# Patient Record
Sex: Male | Born: 1960
Health system: Southern US, Community
[De-identification: ages and names within clinical notes are randomized; demographics above are authoritative.]

---

## 2008-12-26 ENCOUNTER — Emergency Department: Payer: Self-pay | Admitting: Emergency Medicine

## 2012-02-07 ENCOUNTER — Ambulatory Visit: Payer: Self-pay | Admitting: Orthopedic Surgery

## 2012-02-27 ENCOUNTER — Ambulatory Visit: Payer: Self-pay | Admitting: Pain Medicine

## 2013-02-11 ENCOUNTER — Ambulatory Visit: Payer: Self-pay | Admitting: Gastroenterology

## 2013-02-13 LAB — PATHOLOGY REPORT

## 2013-07-05 ENCOUNTER — Inpatient Hospital Stay: Payer: Self-pay | Admitting: Internal Medicine

## 2013-07-05 LAB — COMPREHENSIVE METABOLIC PANEL
ALT: 25 U/L (ref 12–78)
Albumin: 3.4 g/dL (ref 3.4–5.0)
Alkaline Phosphatase: 59 U/L
Anion Gap: 6 — ABNORMAL LOW (ref 7–16)
BUN: 17 mg/dL (ref 7–18)
Bilirubin,Total: 0.9 mg/dL (ref 0.2–1.0)
CO2: 30 mmol/L (ref 21–32)
CREATININE: 1.25 mg/dL (ref 0.60–1.30)
Calcium, Total: 8.9 mg/dL (ref 8.5–10.1)
Chloride: 100 mmol/L (ref 98–107)
GLUCOSE: 111 mg/dL — AB (ref 65–99)
Osmolality: 274 (ref 275–301)
POTASSIUM: 3.9 mmol/L (ref 3.5–5.1)
SGOT(AST): 15 U/L (ref 15–37)
Sodium: 136 mmol/L (ref 136–145)
Total Protein: 7.4 g/dL (ref 6.4–8.2)

## 2013-07-05 LAB — CBC WITH DIFFERENTIAL/PLATELET
Basophil #: 0 10*3/uL (ref 0.0–0.1)
Basophil %: 0.2 %
EOS ABS: 0.1 10*3/uL (ref 0.0–0.7)
EOS PCT: 0.6 %
HCT: 42 % (ref 40.0–52.0)
HGB: 14.5 g/dL (ref 13.0–18.0)
Lymphocyte #: 1.5 10*3/uL (ref 1.0–3.6)
Lymphocyte %: 7.7 %
MCH: 32.9 pg (ref 26.0–34.0)
MCHC: 34.5 g/dL (ref 32.0–36.0)
MCV: 95 fL (ref 80–100)
MONOS PCT: 8.6 %
Monocyte #: 1.7 x10 3/mm — ABNORMAL HIGH (ref 0.2–1.0)
Neutrophil #: 16.5 10*3/uL — ABNORMAL HIGH (ref 1.4–6.5)
Neutrophil %: 82.9 %
Platelet: 231 10*3/uL (ref 150–440)
RBC: 4.4 10*6/uL (ref 4.40–5.90)
RDW: 13.8 % (ref 11.5–14.5)
WBC: 19.9 10*3/uL — AB (ref 3.8–10.6)

## 2013-07-05 LAB — URINALYSIS, COMPLETE
Bacteria: NONE SEEN
Bilirubin,UR: NEGATIVE
Glucose,UR: NEGATIVE mg/dL (ref 0–75)
LEUKOCYTE ESTERASE: NEGATIVE
NITRITE: NEGATIVE
Ph: 6 (ref 4.5–8.0)
Protein: NEGATIVE
RBC,UR: 2 /HPF (ref 0–5)
Specific Gravity: 1.013 (ref 1.003–1.030)
Squamous Epithelial: 2

## 2013-07-05 LAB — LIPID PANEL
Cholesterol: 110 mg/dL (ref 0–200)
HDL: 41 mg/dL (ref 40–60)
Ldl Cholesterol, Calc: 57 mg/dL (ref 0–100)
Triglycerides: 58 mg/dL (ref 0–200)
VLDL CHOLESTEROL, CALC: 12 mg/dL (ref 5–40)

## 2013-07-05 LAB — LIPASE, BLOOD: Lipase: 245 U/L (ref 73–393)

## 2013-07-06 LAB — CBC WITH DIFFERENTIAL/PLATELET
BASOS PCT: 0.2 %
Basophil #: 0 10*3/uL (ref 0.0–0.1)
EOS ABS: 0.3 10*3/uL (ref 0.0–0.7)
Eosinophil %: 2.3 %
HCT: 37.9 % — AB (ref 40.0–52.0)
HGB: 12.5 g/dL — ABNORMAL LOW (ref 13.0–18.0)
LYMPHS ABS: 1 10*3/uL (ref 1.0–3.6)
LYMPHS PCT: 8.2 %
MCH: 31.6 pg (ref 26.0–34.0)
MCHC: 33 g/dL (ref 32.0–36.0)
MCV: 96 fL (ref 80–100)
Monocyte #: 1.3 x10 3/mm — ABNORMAL HIGH (ref 0.2–1.0)
Monocyte %: 10.4 %
NEUTROS ABS: 10 10*3/uL — AB (ref 1.4–6.5)
Neutrophil %: 78.9 %
Platelet: 183 10*3/uL (ref 150–440)
RBC: 3.95 10*6/uL — AB (ref 4.40–5.90)
RDW: 13.9 % (ref 11.5–14.5)
WBC: 12.7 10*3/uL — ABNORMAL HIGH (ref 3.8–10.6)

## 2013-07-06 LAB — COMPREHENSIVE METABOLIC PANEL
ALT: 19 U/L (ref 12–78)
ANION GAP: 7 (ref 7–16)
Albumin: 2.7 g/dL — ABNORMAL LOW (ref 3.4–5.0)
Alkaline Phosphatase: 51 U/L
BUN: 15 mg/dL (ref 7–18)
Bilirubin,Total: 1.3 mg/dL — ABNORMAL HIGH (ref 0.2–1.0)
Calcium, Total: 8 mg/dL — ABNORMAL LOW (ref 8.5–10.1)
Chloride: 103 mmol/L (ref 98–107)
Co2: 28 mmol/L (ref 21–32)
Creatinine: 1.41 mg/dL — ABNORMAL HIGH (ref 0.60–1.30)
EGFR (Non-African Amer.): 57 — ABNORMAL LOW
GLUCOSE: 86 mg/dL (ref 65–99)
Osmolality: 276 (ref 275–301)
Potassium: 3.8 mmol/L (ref 3.5–5.1)
SGOT(AST): 19 U/L (ref 15–37)
SODIUM: 138 mmol/L (ref 136–145)
Total Protein: 6.4 g/dL (ref 6.4–8.2)

## 2013-07-06 LAB — LIPID PANEL
CHOLESTEROL: 94 mg/dL (ref 0–200)
HDL Cholesterol: 28 mg/dL — ABNORMAL LOW (ref 40–60)
Ldl Cholesterol, Calc: 51 mg/dL (ref 0–100)
Triglycerides: 76 mg/dL (ref 0–200)
VLDL CHOLESTEROL, CALC: 15 mg/dL (ref 5–40)

## 2013-07-07 LAB — COMPREHENSIVE METABOLIC PANEL
ALBUMIN: 2.8 g/dL — AB (ref 3.4–5.0)
ALK PHOS: 53 U/L
ALT: 20 U/L (ref 12–78)
ANION GAP: 4 — AB (ref 7–16)
AST: 18 U/L (ref 15–37)
BUN: 15 mg/dL (ref 7–18)
Bilirubin,Total: 0.4 mg/dL (ref 0.2–1.0)
CO2: 29 mmol/L (ref 21–32)
CREATININE: 1.45 mg/dL — AB (ref 0.60–1.30)
Calcium, Total: 8.2 mg/dL — ABNORMAL LOW (ref 8.5–10.1)
Chloride: 104 mmol/L (ref 98–107)
EGFR (African American): >60
GFR CALC NON AF AMER: 55 — AB
GLUCOSE: 80 mg/dL (ref 65–99)
Osmolality: 274 (ref 275–301)
POTASSIUM: 3.7 mmol/L (ref 3.5–5.1)
SODIUM: 137 mmol/L (ref 136–145)
TOTAL PROTEIN: 7 g/dL (ref 6.4–8.2)

## 2013-07-07 LAB — CBC WITH DIFFERENTIAL/PLATELET
BASOS ABS: 0 10*3/uL (ref 0.0–0.1)
Basophil %: 0.3 %
EOS PCT: 3 %
Eosinophil #: 0.3 10*3/uL (ref 0.0–0.7)
HCT: 38 % — ABNORMAL LOW (ref 40.0–52.0)
HGB: 12.6 g/dL — ABNORMAL LOW (ref 13.0–18.0)
LYMPHS ABS: 1.7 10*3/uL (ref 1.0–3.6)
Lymphocyte %: 16.4 %
MCH: 32.4 pg (ref 26.0–34.0)
MCHC: 33.3 g/dL (ref 32.0–36.0)
MCV: 98 fL (ref 80–100)
MONO ABS: 0.9 x10 3/mm (ref 0.2–1.0)
MONOS PCT: 8.5 %
Neutrophil #: 7.5 10*3/uL — ABNORMAL HIGH (ref 1.4–6.5)
Neutrophil %: 71.8 %
PLATELETS: 231 10*3/uL (ref 150–440)
RBC: 3.9 10*6/uL — ABNORMAL LOW (ref 4.40–5.90)
RDW: 14 % (ref 11.5–14.5)
WBC: 10.5 10*3/uL (ref 3.8–10.6)

## 2013-07-09 LAB — COMPREHENSIVE METABOLIC PANEL
ALBUMIN: 2.3 g/dL — AB (ref 3.4–5.0)
ALK PHOS: 44 U/L — AB
ANION GAP: 8 (ref 7–16)
BUN: 11 mg/dL (ref 7–18)
Bilirubin,Total: 0.3 mg/dL (ref 0.2–1.0)
CHLORIDE: 106 mmol/L (ref 98–107)
CO2: 29 mmol/L (ref 21–32)
Calcium, Total: 8.1 mg/dL — ABNORMAL LOW (ref 8.5–10.1)
Creatinine: 1.35 mg/dL — ABNORMAL HIGH (ref 0.60–1.30)
EGFR (African American): >60
EGFR (Non-African Amer.): 60 — ABNORMAL LOW
Glucose: 92 mg/dL (ref 65–99)
OSMOLALITY: 284 (ref 275–301)
Potassium: 3.4 mmol/L — ABNORMAL LOW (ref 3.5–5.1)
SGOT(AST): 27 U/L (ref 15–37)
SGPT (ALT): 25 U/L (ref 12–78)
Sodium: 143 mmol/L (ref 136–145)
Total Protein: 6.1 g/dL — ABNORMAL LOW (ref 6.4–8.2)

## 2014-05-02 NOTE — Consult Note (Signed)
Pt feels better, had 4-5 good bowel movements from the Miralax last night.  got morphine once and percocet once during nite.  Abd pain now rated a 4-5.  Palpation of abd shows no signif tenderness, no masses. He is afebrile and WBC down to normal.  Will try on low fat diet and if does well could go home on pain meds and laxatives.  Electronic Signatures: Manya Silvas (MD)  (Signed on 30-Jun-15 07:12)  Authored  Last Updated: 30-Jun-15 07:12 by Manya Silvas (MD)

## 2014-05-02 NOTE — Consult Note (Signed)
Pt still with discomfort, getting pain meds which helps some.  He is afebrile and WBC down to 12.7, his TB is up slightly and need to follow this and repeat LFT's tomorrow. Will give Fleets enema and MOM for bowels.  A possible stone could be origin of all problems.  Consider Actigal to prevent future stone formation.  Will give water today and clear liq tomorrow.  Follow labs and pain report.  Electronic Signatures: Manya Silvas (MD)  (Signed on 28-Jun-15 11:40)  Authored  Last Updated: 28-Jun-15 11:40 by Manya Silvas (MD)

## 2014-05-02 NOTE — Consult Note (Signed)
Pt has CT showing pancreatitis in head of pancreas.  A small bubble like place may be diverticulum of duodenum.  He is likely 3 days into a spell of pancreatitis with hx of fever, constipation (ileus) and abd pain and tenderness.  Dr. Leanora Cover was kind enough to review the CT and agrees with this diagnosis.  Pancreas divisum is a possible cause, could have passed a stone as the cause.  I explained to them that there was no definitive treatment except support with fluids and pain medicine.  Electronic Signatures: Manya Silvas (MD)  (Signed on 27-Jun-15 18:45)  Authored  Last Updated: 27-Jun-15 18:45 by Manya Silvas (MD)

## 2014-05-02 NOTE — Consult Note (Signed)
PATIENT NAME:  Eric Fitzpatrick, Eric Fitzpatrick MR#:  [id number] DATE OF BIRTH:  [birth year]  DATE OF CONSULTATION:  07/05/2013  CONSULTING PHYSICIAN:  Manya Silvas, MD  HISTORY OF PRESENT ILLNESS: The patient is a 54 year old white male whose story began Wednesday afternoon. He noted abdominal pain and pain in his back. Wednesday was his last bowel movement. He felt discomfort again Thursday, the same abdominal pain and pain in the back. Friday he went to work feeling bad. He was found to have a temperature elevation of 101.7. He took an Advil and a colon cleanser before going to bed. Temperature today was up to 102. He feels like he hurts all around his abdomen and into his back. He feels kind of impacted. He has not had a good bowel movement in 3 days. When he tries to push and have a bowel movement he gets pain in the back of his head and pain in his back. He has 2 herniated disks that he thinks are hurting when he squeezes down to move his bowels. He denies any vomiting. There is no rash. No sore throat. Yesterday he ate a little bit. Today he has not eaten anything. There is no change in the pain with eating. His last big meal was Thursday night. He felt very uncomfortable for several hours after eating and felt some better Friday morning. He has never had a spell like this before.   ALLERGIES: No known drug allergies.   MEDICATIONS: Trazodone, Uloric, Ambien, Xanax, hydrochlorothiazide, tramadol, metoprolol, lisinopril, and Synthroid.   PAST MEDICAL HISTORY: He has had kidney surgery when he was 54 years old and had bladder surgery. They "took out 2 kidneys" and did bladder work. Dr. Ernst Spell did a vasectomy reattachment. Apparently it had been cut when he had his kidney surgery.   SOCIAL HISTORY: He works as a Pharmacist, hospital. He recently went on a business trip to [place] where he stayed a week. He did have a bit of a sore throat while he was there. He actually was there June 1st through the 12th. He ate  local food but did not eat any street vendor food. He has never had an illness like his current illness   In the ER he had a CAT scan and they called it pancreatitis in the head of the pancreas, but not in the body and the tail. A small pocket of a gas could be noted in the pancreas. Dr. Leanora Cover was kind enough to look at the CAT scan also and agreed with the pancreatitis call but felt that the small pocket of gas was likely from a duodenal diverticulum.   OTHER MEDICAL PROBLEMS: Include gout and hypertension and moderate obesity   PHYSICAL EXAMINATION:  VITAL SIGNS: Temperature 97, pulse 78, respirations 20, blood pressure 154/89. GENERAL: A somewhat obese white male in no acute distress.  HEENT: Sclerae nonicteric. Conjunctivae negative. Tongue negative. The head is atraumatic.  CHEST: Clear.  HEART: No murmurs or gallops I can hear.  ABDOMEN: Old scars present in the suprapubic area. The bowel sounds are present but diminished. No hepatosplenomegaly. There is diffuse mild tenderness, worse in the epigastric area.  SKIN: Warm and dry.  PSYCHIATRIC: Mood and affect are appropriate.   LABORATORY DATA: Ultrasound shows no abnormalities. Ultrasound of the abdomen shows no abnormalities. CAT scan of the abdomen shows the mentioned findings in the pancreas.   Blood work: A MET-C shows a glucose of 111, otherwise MET-C is completely negative. Liver panel is negative.  White count is 19.9, hemoglobin 14.5, hematocrit 42. Urinalysis 1+ blood, trace ketones, negative leukocyte esterases.   A 3-way abdomen and chest shows small bowel decompressed, normal distribution of gas and stool throughout the colon. No abnormality seen.   Also his lipase is normal.   ASSESSMENT: This is probably a spell of pancreatitis into the third day or so. He has a normal lipase, which makes the diagnosis a little more difficult. The etiology of this is uncertain. He is not a significant alcohol a drinker. Most likely cause  would be a passed gallstone, could have happened 3 days ago, or so. Another possibility brought  up by Dr. Leanora Cover is he could have pancreas divisum producing pancreatitis. I explained to the patient and his wife that the treatment is essentially supportive, that there is no definitive therapy for pancreatitis except bowel rest. He Eric Fitzpatrick be given pain medication as needed,  nausea medicine as needed, and IV fluids and I Eric Fitzpatrick follow with you.    ____________________________ Manya Silvas, MD rte:lt D: 07/05/2013 18:54:00 ET T: 07/05/2013 19:33:50 ET JOB#: [id number]  cc: Ceasar Lund. Anselm Jungling, [place] Sarina Ser, MD Manya Silvas, MD, <Dictator>   Manya Silvas MD ELECTRONICALLY SIGNED 07/22/2013 17:41

## 2014-05-02 NOTE — Discharge Summary (Signed)
Dates of Admission and Diagnosis:  Date of Admission 05-Jul-2013   Date of Discharge 09-Jul-2013   Admitting Diagnosis Abdominal pain, fever, constipation   Final Diagnosis Acute pancreatitis   Discharge Diagnosis 1 Acute pancreatitis   2 Hypertension   3 hypothyroidism    Chief Complaint/History of Present Illness 54 year old male presented to the ED with abdominal pain, fever and constipation for three days. CT abdomen revealed + inflammation of head of pancreas, c/w acute pancreatitis, RUQ u/s was negative.   Allergies:  No Known Allergies:     Hepatic:  01-Jul-15 04:07   Bilirubin, Total 0.3  Alkaline Phosphatase  44 (45-117 NOTE: New Reference Range 11/29/12)  SGPT (ALT) 25  SGOT (AST) 27  Total Protein, Serum  6.1  Albumin, Serum  2.3  Routine Chem:  27-Jun-15 10:26   Creatinine (comp) 1.25  28-Jun-15 04:57   Creatinine (comp)  1.41  29-Jun-15 04:44   Creatinine (comp)  1.45  01-Jul-15 04:07   Glucose, Serum 92  BUN 11  Creatinine (comp)  1.35  Sodium, Serum 143  Potassium, Serum  3.4  Chloride, Serum 106  CO2, Serum 29  Calcium (Total), Serum  8.1  Osmolality (calc) 284  eGFR (African American) >60  eGFR (Non-African American)  60 (eGFR values <35m/min/1.73 m2 may be an indication of chronic kidney disease (CKD). Calculated eGFR is useful in patients with stable renal function. The eGFR calculation will not be reliable in acutely ill patients when serum creatinine is changing rapidly. It is not useful in  patients on dialysis. The eGFR calculation may not be applicable to patients at the low and high extremes of body sizes, pregnant women, and vegetarians.)  Anion Gap 8  Routine Hem:  27-Jun-15 10:26   WBC (CBC)  19.9  28-Jun-15 04:57   WBC (CBC)  12.7  29-Jun-15 04:44   WBC (CBC) 10.5   PERTINENT RADIOLOGY STUDIES: XRay:    27-Jun-15 11:27, Abdomen 3 Way Includes PA Chest  Abdomen 3 Way Includes PA Chest   REASON FOR EXAM:     Pain  COMMENTS:   May transport without cardiac monitor    PROCEDURE: DXR - DXR ABDOMEN 3-WAY (INCL PA CXR)  - Jul 05 2013 11:27AM     CLINICAL DATA:  lower abd pain, nausea, last BM Wednesday    EXAM:  ABDOMEN SERIES    COMPARISON:  None.    FINDINGS:  Lungs are clear. Heart size upper limits normal. No effusion. No  free air. Small bowel decompressed. Normal distribution gas and  stool throughout the colon. Surgical clips in the pelvis. Regional  bones unremarkable. No abnormal abdominal calcification.     IMPRESSION:  1. No acute abnormality.      Electronically Signed    By: DArne ClevelandM.D.    On: 07/05/2013 11:33         Verified By: DKandis Cocking M.D.,  [place]    27-Jun-15 11:52, [place]  [place] Survey   REASON FOR EXAM:    ruq pain, nausea, tender, +murphy. anorexia  COMMENTS:   May transport without cardiac monitor    PROCEDURE: [place] - [place] LIMITED SURVEY  - Jul 05 2013 11:52AM     CLINICAL DATA:  ruq pain, nausea, tender, +murphy. anorexia    EXAM:  [place] - RIGHT UPPER QUADRANT    COMPARISON:  Radiographs from the same day    FINDINGS:  Gallbladder:  There is  no evidence for gallstones, gallbladder wall thickening or  pericholecystic fluid. The sonographer reports no sonographic  Murphy's sign.    Common bile duct:    Diameter: 3.7 mm , unremarkable    Liver:    No focal lesion identified. Within normal limits in parenchymal  echogenicity.     IMPRESSION:  1. Negative.  Normal gallbladder.  Electronically Signed    By: Arne Cleveland M.D.    On: 07/05/2013 11:54         Verified By: Kandis Cocking, M.D.,  CT:    27-Jun-15 14:48, CT Abdomen and Pelvis With Contrast  CT Abdomen and Pelvis With Contrast   REASON FOR EXAM:    (1) diffuse abd pain x 3 days, anorexia, wbc 20, ruq   + rlq ttp; (2) diffuse abd  COMMENTS:   May transport without cardiac monitor    PROCEDURE: CT  - CT  ABDOMEN / PELVIS  W  - Jul 05 2013  2:48PM     CLINICAL DATA:  Abdominal pain.    EXAM:  CT ABDOMEN AND PELVIS WITH CONTRAST    TECHNIQUE:  Multidetector CT imaging of the abdomen and pelvis was performed  using the standard protocol following bolus administration of  intravenous contrast.  CONTRAST:  85 cc Isovue-300    COMPARISON:  None.    FINDINGS:  Diffuse hepatic steatosis. Tiny hypodensity adjacent to the  gallbladder is nonspecific.    Spleen, adrenal glands are within normal limits    Inflammatory changes involving the head of the pancreas are present.  There is a loculated gas in the head of the pancreas which may  simply represent biliary gas. No hemorrhage or acute fluid  collection.  Right kidney is severely atrophic and is virtually non functioning.    Left hydronephrosis is present. No ureteral calculus. Contrast is  excreted into the left collecting system up on delayed imaging.  Postoperative changes are seen in the region of the distal left  ureter.    Bladder is distended.    Cecum is distended with stool.     IMPRESSION:  Findings consistent with acute pancreatitis. There is a loculated  gas in the pancreatic head which may represent biliary gas as one  would see with prior biliary instrumentation. An inflammatory  process within the pancreatic parenchyma cannot be excluded.    Severe atrophy of the right kidney.      Electronically Signed    By: Maryclare Bean M.D.    On: 07/05/2013 15:26         Verified By: Jamas Lav, M.D.,   Pertinent Past History:  Pertinent Past History Hypertension Hypothyroidism Gout   Hospital Course:  Hospital Course 54 year old male admitted with abdominal pain, fever and constipation, CT abdomen revealed + inflammation of head of pancreas, c/w acute pancreatitis, RUQ u/s was negative. Patient was evaluated by GI, impression hehas likely recently passed a gallstone. Patient was initially NPO. He received  supportive care, including i.v. fluids, i.v Zosyn, Pantoprazole, pain mmt and Zofran prn. Abdominal pain decreased gradually. Nausea and vomiting resolved. Diet was advanced to low fat diet. Lisinopril and Metoprolol were continued for hypertension. He received his usual dose of Levothyroxine for hypothyroidism. Patient is feeling much better today. He is eating lunch. Denies any nausea. No episodes of vomiting for 24 hours. He is eager to go home today. Wife at bedside. Exam: Alert, oriented x 3, NAD. Chest: clear, CVS:RRR, Abdomen: soft, nontender, + BS,  Ext: no edema. Plan is to discharge home today.   Condition on Discharge Stable   DISCHARGE INSTRUCTIONS HOME MEDS:  Medication Reconciliation: Patient's Home Medications at Discharge:     Medication Instructions  trazodone 100 mg oral tablet  1 tab(s) orally once (at bedtime)   levothyrox 175 mcg (0.175 mg) oral tablet  1 tab(s) orally once a day   lisinopril 10 mg oral tablet  1 tab(s) orally once a day   metoprolol 100 mg oral tablet  1 tab(s) orally 2 times a day   hydrochlorothiazide 25 mg oral tablet  1 tab(s) orally once a day   alprazolam 0.5 mg oral tablet  1 tab(s) orally 2 times a day   zolpidem 10 mg oral tablet  1 tab(s) orally once a day (at bedtime)   uloric 40 mg oral tablet  1 tab(s) orally once a day   oxycodone 10 mg oral tablet  1 tab(s) orally every 4 hours, As needed, pain   pantoprazole 40 mg oral delayed release tablet  1 tab(s) orally once a day    STOP TAKING THE FOLLOWING MEDICATION(S):    tramadol 50 mg oral tablet: 1 tab(s) orally every 4 hours, As Needed - for Pain  Physician's Instructions:  Diet Low Sodium  Low Fat, Low Cholesterol   Activity Limitations As tolerated   Return to Work on Monday   Time frame for Follow Up Appointment 1-2 weeks  Dr. Gilford Rile   Time frame for Follow Up Appointment 1-2 weeks  [facility]     Gaylyn Cheers T(Consultant): [facility], [address] [place],  [place] [address], [place]   Lynett Fish B(Family Physician): [facility], [address] [place],  [address], [place] [phone]  Electronic Signatures: Glendon Axe (MD)  (Signed 01-Jul-15 13:32)  Authored: ADMISSION DATE AND DIAGNOSIS, CHIEF COMPLAINT/HPI, Allergies, PERTINENT LABS, PERTINENT RADIOLOGY STUDIES, PERTINENT PAST HISTORY, HOSPITAL COURSE, [place], PATIENT INSTRUCTIONS, Follow Up Physician   Last Updated: 01-Jul-15 13:32 by Glendon Axe (MD)

## 2014-05-02 NOTE — Consult Note (Signed)
Pt slept well and did not get pain medication during nite, more sore today, VSS and WBC nl.  He did not get any bowel movement with the Fleets enema so will give 2 quarts of gatoraid today.  He has problem with veins and IV site accsss, may get a PIC line.   Electronic Signatures: Manya Silvas (MD)  (Signed on 29-Jun-15 13:19)  Authored  Last Updated: 29-Jun-15 13:19 by Manya Silvas (MD)

## 2014-05-02 NOTE — H&P (Signed)
PATIENT NAME:  Eric Fitzpatrick, Eric Fitzpatrick MR#:  [id number] DATE OF BIRTH:  [birth year]  DATE OF ADMISSION:  07/05/2013  NO DICTATION  ____________________________ Ceasar Lund. Anselm Jungling, MD vgv:lt D: 07/05/2013 16:50:57 ET T: 07/05/2013 20:20:07 ET JOB#: [id number]  cc: Ceasar Lund. Anselm Jungling, MD, <Dictator> Vaughan Basta MD ELECTRONICALLY SIGNED 07/07/2013 11:10

## 2014-05-02 NOTE — H&P (Signed)
PATIENT NAME:  Eric Fitzpatrick, EDELL MR#:  [id number] DATE OF BIRTH:  1960/09/19  DATE OF ADMISSION:  07/05/2013  PRIMARY CARE PHYSICIAN: Dr. Lisette Grinder, [facility].   REFERRING PHYSICIAN: Dr. Carrie Mew with ER.  CHIEF COMPLAINT: Abdominal pain.   HISTORY OF PRESENTING ILLNESS: This is a 54 year old male with a past history of hypertension, hypothyroidism, and gout who is in his usual state of health. He has visited [place] for his business purpose 2 weeks ago, and since he came, he is feeling a little fatigued, and now for the last 3-4 days, he started having abdominal pain which is in the central area, constant, pressure-like, 8/10. No nausea or vomiting but feels like bloated and is constipated.  Passing just minimal gas, but no stool for the last 3 days, and so because of that, he is not feeling too much hungry and he stopped eating since yesterday.  Decided to come to Emergency Room as he also noticed some fever last evening 101.7 and today morning 102 degrees Fahrenheit. In ER, the x-ray was negative, but CT scan of the abdomen showed some inflammation of pancreatic head and some gas around the pancreatic duct.  ER physician spoke to Dr. Vira Agar, GI on-call doctor, and he suggested to admit the patient for acute pancreatitis. Hospitalist service was contacted.   REVIEW OF SYSTEMS:  CONSTITUTIONAL: Negative for fatigue but positive for fever and abdominal pain.   EYES:  No blurring, double vision, discharge or redness.  EARS, NOSE, THROAT: No tinnitus, ear pain or hearing loss.  RESPIRATORY: No cough, wheezing, hemoptysis or shortness of breath.  CARDIOVASCULAR: No chest pain, orthopnea, edema, arrhythmia, palpitations.  GASTROINTESTINAL: The patient does not have nausea, vomiting or diarrhea, but has abdominal pain and constipation.  GENITOURINARY: No dysuria, hematuria or increased frequency.  ENDOCRINE: No increased sweating. No heat or cold intolerance.  SKIN: No acne, rashes, or  lesions.  MUSCULOSKELETAL: No pain or swelling in the joints.  NEUROLOGICAL: No numbness, weakness, tremor, or vertigo.  PSYCHIATRIC: No anxiety, insomnia, bipolar disorder.   PAST MEDICAL HISTORY:  Hypertension, hypothyroid, and gout.   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    SOCIAL HISTORY:  Denies any alcohol, smoking, or illegal drug use. He just drinks socially, which is not even once a month. 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   FAMILY HISTORY: Father had leukemia.   HOME MEDICATIONS: 1. Zolpidem 10 mg oral tablet once a day.  2. Uloric 40 mg oral once a day.  3. Trazodone100 mg oral once a day.  4. Tramadol 50 mg oral tablet every 4 hours as needed for pain.  5. Metoprolol 100 mg oral 2 times a day.  6. Lisinopril 10 mg oral once a day.  7. Levothyroxine 175 mcg oral once a day.  8. Hydrochlorothiazide 25 mg oral once a day.  9. Alprazolam 0.5 mg oral tablet 2 times a day.   VITAL SIGNS: In ER, temperature 98.6, pulse 76, respirations 18, blood pressure 152/98, and pulse oximetry is 96 on room air.   PHYSICAL EXAMINATION: GENERAL:  The patient is fully alert and oriented to time, place, and person. Does not appear in any acute distress.  HEENT: Head and neck atraumatic. Conjunctivae pink. Oral mucosa moist.  NECK: Supple. No JVD.  RESPIRATORY: Bilaterally equal and clear air entry.  CARDIOVASCULAR: S1, S2 present, regular. No murmur.  ABDOMEN: Soft, nontender. Bowel  sounds present. No organomegaly but appears to be slightly distended.  SKIN: No rashes, acne or lesions.  MUSCULOSKELETAL: No pain or swelling in the joints.  NEUROLOGICAL: No numbness, weakness, tremor. Moves all 4 limbs. Power 5/5.  No rigidity. JOINTS: No swelling or tenderness.  LEGS: No edema.   PSYCHIATRIC: Does not appear in any acute psychiatric illness at this time.   IMPORTANT LABORATORY AND DIAGNOSTIC RESULTS:   Abdomen x-ray:  No acute abnormality. CT scan of the abdomen and pelvis with contrast showed consistent with acute pancreatitis, loculated gas in the pancreatic head, biliary gas and inflammatory process. Pancreatic parenchyma cannot be excluded. Ultrasound of abdomen is done and is negative for gallbladder findings.  Glucose 111, BUN 17, creatinine 1.25, sodium 136, potassium 3.9, chloride is 100. CO2 is 30 and calcium is 8.9. Total protein 7.4, albumin 3.4, bilirubin 0.9, alkaline phosphatase 59, SGOT 15, and SGPT 25. WBC is 19.9, hemoglobin 14.5, platelet count is 231,000. MCV is 95. Urinalysis is grossly negative.   ASSESSMENT AND PLAN: A 55 year old male with history of hypertension, hypothyroid, and gout with recent visit to [place] and feeling somewhat fatigued after that. Having abdominal pain for 3 days and found having pancreatitis on CT abdomen.  Lipase is normal.  1. Acute pancreatitis. We will keep him n.p.o. and put him on pain management and nausea management with IV medications with IV fluids. GI consult is called in by ER physician with Dr. Vira Agar to find out the cause.  Patient says he is known alcoholic. There is no gallstone present. We will check the lipid panel to find out if that is the cause of his pancreatitis. Medications list, does not seem to be any medication which is causing pancreatitis. Recent visit to [place] and feeling somewhat fatigued after that. Maybe there is some correlation of viral illness from there.  I will leave it up to Dr. Vira Agar to evaluate this issue.  2. Hypertension. We will continue his home medications.  3. Hypothyroidism. Continue levothyroxine.  4. Gout.  Currently, there is no acute illness, so we will hold the medications. He will be on pain medication.  5. Elevated white cell count. This might be for acute pancreatitis.   TOTAL TIME SPENT ON THIS ADMISSION: 50 minutes.   Primary care physician, Dr. Lisette Grinder, will assign him later  on.   ____________________________ Ceasar Lund. Anselm Jungling, MD vgv:dd D: 07/05/2013 16:50:00 ET T: 07/05/2013 19:49:36 ET JOB#: [id number]  cc: Ceasar Lund. Anselm Jungling, MD, <Dictator> Vaughan Basta MD ELECTRONICALLY SIGNED 07/07/2013 11:10

## 2016-02-05 DIAGNOSIS — J029 Acute pharyngitis, unspecified: Secondary | ICD-10-CM | POA: Diagnosis not present

## 2016-03-13 DIAGNOSIS — Z86010 Personal history of colonic polyps: Secondary | ICD-10-CM | POA: Diagnosis not present

## 2016-03-16 ENCOUNTER — Other Ambulatory Visit
Admission: RE | Admit: 2016-03-16 | Discharge: 2016-03-16 | Disposition: A | Discharge status: Home / Self Care | Payer: 59 | Source: Ambulatory Visit | Attending: Internal Medicine | Admitting: Internal Medicine

## 2016-03-16 DIAGNOSIS — R51 Headache: Secondary | ICD-10-CM | POA: Insufficient documentation

## 2016-03-16 LAB — INFLUENZA PANEL BY PCR (TYPE A & B)
INFLBPCR: NEGATIVE
Influenza A By PCR: POSITIVE — AB

## 2016-06-15 DIAGNOSIS — D128 Benign neoplasm of rectum: Secondary | ICD-10-CM | POA: Diagnosis not present

## 2016-06-15 DIAGNOSIS — K620 Anal polyp: Secondary | ICD-10-CM | POA: Diagnosis not present

## 2016-06-15 DIAGNOSIS — D126 Benign neoplasm of colon, unspecified: Secondary | ICD-10-CM | POA: Diagnosis not present

## 2016-06-15 DIAGNOSIS — Z86010 Personal history of colonic polyps: Secondary | ICD-10-CM | POA: Diagnosis not present

## 2016-06-18 DIAGNOSIS — D126 Benign neoplasm of colon, unspecified: Secondary | ICD-10-CM | POA: Diagnosis not present

## 2016-06-18 DIAGNOSIS — K620 Anal polyp: Secondary | ICD-10-CM | POA: Diagnosis not present

## 2016-06-18 DIAGNOSIS — Z86010 Personal history of colonic polyps: Secondary | ICD-10-CM | POA: Diagnosis not present

## 2016-07-24 DIAGNOSIS — I10 Essential (primary) hypertension: Secondary | ICD-10-CM | POA: Diagnosis not present

## 2016-07-24 DIAGNOSIS — E784 Other hyperlipidemia: Secondary | ICD-10-CM | POA: Diagnosis not present

## 2016-07-24 DIAGNOSIS — M5136 Other intervertebral disc degeneration, lumbar region: Secondary | ICD-10-CM | POA: Diagnosis not present

## 2016-07-24 DIAGNOSIS — M109 Gout, unspecified: Secondary | ICD-10-CM | POA: Diagnosis not present

## 2016-07-24 DIAGNOSIS — Z125 Encounter for screening for malignant neoplasm of prostate: Secondary | ICD-10-CM | POA: Diagnosis not present

## 2016-07-28 DIAGNOSIS — Z0000 Encounter for general adult medical examination without abnormal findings: Secondary | ICD-10-CM | POA: Diagnosis not present

## 2016-07-28 DIAGNOSIS — K760 Fatty (change of) liver, not elsewhere classified: Secondary | ICD-10-CM | POA: Diagnosis not present

## 2016-07-28 DIAGNOSIS — I10 Essential (primary) hypertension: Secondary | ICD-10-CM | POA: Diagnosis not present

## 2016-09-15 DIAGNOSIS — I10 Essential (primary) hypertension: Secondary | ICD-10-CM | POA: Diagnosis not present

## 2016-09-15 DIAGNOSIS — E034 Atrophy of thyroid (acquired): Secondary | ICD-10-CM | POA: Diagnosis not present

## 2016-09-15 DIAGNOSIS — E784 Other hyperlipidemia: Secondary | ICD-10-CM | POA: Diagnosis not present

## 2017-02-09 DIAGNOSIS — M109 Gout, unspecified: Secondary | ICD-10-CM | POA: Diagnosis not present

## 2017-03-08 DIAGNOSIS — I10 Essential (primary) hypertension: Secondary | ICD-10-CM | POA: Diagnosis not present

## 2017-03-08 DIAGNOSIS — E7849 Other hyperlipidemia: Secondary | ICD-10-CM | POA: Diagnosis not present

## 2017-03-08 DIAGNOSIS — M1A09X0 Idiopathic chronic gout, multiple sites, without tophus (tophi): Secondary | ICD-10-CM | POA: Diagnosis not present

## 2017-03-08 DIAGNOSIS — M4696 Unspecified inflammatory spondylopathy, lumbar region: Secondary | ICD-10-CM | POA: Diagnosis not present

## 2017-03-08 DIAGNOSIS — R739 Hyperglycemia, unspecified: Secondary | ICD-10-CM | POA: Diagnosis not present

## 2017-03-19 DIAGNOSIS — I10 Essential (primary) hypertension: Secondary | ICD-10-CM | POA: Diagnosis not present

## 2017-03-19 DIAGNOSIS — E034 Atrophy of thyroid (acquired): Secondary | ICD-10-CM | POA: Diagnosis not present

## 2017-03-19 DIAGNOSIS — M47816 Spondylosis without myelopathy or radiculopathy, lumbar region: Secondary | ICD-10-CM | POA: Diagnosis not present

## 2017-05-08 DIAGNOSIS — M1A09X0 Idiopathic chronic gout, multiple sites, without tophus (tophi): Secondary | ICD-10-CM | POA: Diagnosis not present

## 2017-05-08 DIAGNOSIS — I10 Essential (primary) hypertension: Secondary | ICD-10-CM | POA: Diagnosis not present

## 2017-05-15 DIAGNOSIS — E034 Atrophy of thyroid (acquired): Secondary | ICD-10-CM | POA: Diagnosis not present

## 2017-05-15 DIAGNOSIS — Q762 Congenital spondylolisthesis: Secondary | ICD-10-CM | POA: Diagnosis not present

## 2017-05-15 DIAGNOSIS — I10 Essential (primary) hypertension: Secondary | ICD-10-CM | POA: Diagnosis not present

## 2017-08-08 DIAGNOSIS — I10 Essential (primary) hypertension: Secondary | ICD-10-CM | POA: Diagnosis not present

## 2017-08-08 DIAGNOSIS — M1A09X0 Idiopathic chronic gout, multiple sites, without tophus (tophi): Secondary | ICD-10-CM | POA: Diagnosis not present

## 2017-08-08 DIAGNOSIS — E7849 Other hyperlipidemia: Secondary | ICD-10-CM | POA: Diagnosis not present

## 2017-08-08 DIAGNOSIS — R739 Hyperglycemia, unspecified: Secondary | ICD-10-CM | POA: Diagnosis not present

## 2017-08-08 DIAGNOSIS — M5136 Other intervertebral disc degeneration, lumbar region: Secondary | ICD-10-CM | POA: Diagnosis not present

## 2017-08-16 DIAGNOSIS — E034 Atrophy of thyroid (acquired): Secondary | ICD-10-CM | POA: Diagnosis not present

## 2017-08-16 DIAGNOSIS — I10 Essential (primary) hypertension: Secondary | ICD-10-CM | POA: Diagnosis not present

## 2017-08-16 DIAGNOSIS — K760 Fatty (change of) liver, not elsewhere classified: Secondary | ICD-10-CM | POA: Diagnosis not present

## 2017-12-24 DIAGNOSIS — M9905 Segmental and somatic dysfunction of pelvic region: Secondary | ICD-10-CM | POA: Diagnosis not present

## 2017-12-24 DIAGNOSIS — M5416 Radiculopathy, lumbar region: Secondary | ICD-10-CM | POA: Diagnosis not present

## 2017-12-24 DIAGNOSIS — M9903 Segmental and somatic dysfunction of lumbar region: Secondary | ICD-10-CM | POA: Diagnosis not present

## 2017-12-25 DIAGNOSIS — M5416 Radiculopathy, lumbar region: Secondary | ICD-10-CM | POA: Diagnosis not present

## 2017-12-25 DIAGNOSIS — M9903 Segmental and somatic dysfunction of lumbar region: Secondary | ICD-10-CM | POA: Diagnosis not present

## 2017-12-25 DIAGNOSIS — M9905 Segmental and somatic dysfunction of pelvic region: Secondary | ICD-10-CM | POA: Diagnosis not present

## 2017-12-28 DIAGNOSIS — M5416 Radiculopathy, lumbar region: Secondary | ICD-10-CM | POA: Diagnosis not present

## 2017-12-28 DIAGNOSIS — M9905 Segmental and somatic dysfunction of pelvic region: Secondary | ICD-10-CM | POA: Diagnosis not present

## 2017-12-28 DIAGNOSIS — M9903 Segmental and somatic dysfunction of lumbar region: Secondary | ICD-10-CM | POA: Diagnosis not present

## 2018-01-01 DIAGNOSIS — M9903 Segmental and somatic dysfunction of lumbar region: Secondary | ICD-10-CM | POA: Diagnosis not present

## 2018-01-01 DIAGNOSIS — M9905 Segmental and somatic dysfunction of pelvic region: Secondary | ICD-10-CM | POA: Diagnosis not present

## 2018-01-01 DIAGNOSIS — M5416 Radiculopathy, lumbar region: Secondary | ICD-10-CM | POA: Diagnosis not present

## 2018-01-07 DIAGNOSIS — M5416 Radiculopathy, lumbar region: Secondary | ICD-10-CM | POA: Diagnosis not present

## 2018-01-07 DIAGNOSIS — M9905 Segmental and somatic dysfunction of pelvic region: Secondary | ICD-10-CM | POA: Diagnosis not present

## 2018-01-07 DIAGNOSIS — M9903 Segmental and somatic dysfunction of lumbar region: Secondary | ICD-10-CM | POA: Diagnosis not present

## 2018-01-11 DIAGNOSIS — M9905 Segmental and somatic dysfunction of pelvic region: Secondary | ICD-10-CM | POA: Diagnosis not present

## 2018-01-11 DIAGNOSIS — M9903 Segmental and somatic dysfunction of lumbar region: Secondary | ICD-10-CM | POA: Diagnosis not present

## 2018-01-11 DIAGNOSIS — M5416 Radiculopathy, lumbar region: Secondary | ICD-10-CM | POA: Diagnosis not present

## 2018-01-29 DIAGNOSIS — M5416 Radiculopathy, lumbar region: Secondary | ICD-10-CM | POA: Diagnosis not present

## 2018-01-29 DIAGNOSIS — M9903 Segmental and somatic dysfunction of lumbar region: Secondary | ICD-10-CM | POA: Diagnosis not present

## 2018-01-29 DIAGNOSIS — M9905 Segmental and somatic dysfunction of pelvic region: Secondary | ICD-10-CM | POA: Diagnosis not present

## 2018-02-18 DIAGNOSIS — M5416 Radiculopathy, lumbar region: Secondary | ICD-10-CM | POA: Diagnosis not present

## 2018-02-18 DIAGNOSIS — M9905 Segmental and somatic dysfunction of pelvic region: Secondary | ICD-10-CM | POA: Diagnosis not present

## 2018-02-18 DIAGNOSIS — M9903 Segmental and somatic dysfunction of lumbar region: Secondary | ICD-10-CM | POA: Diagnosis not present

## 2018-03-04 DIAGNOSIS — M5416 Radiculopathy, lumbar region: Secondary | ICD-10-CM | POA: Diagnosis not present

## 2018-03-04 DIAGNOSIS — M9903 Segmental and somatic dysfunction of lumbar region: Secondary | ICD-10-CM | POA: Diagnosis not present

## 2018-03-04 DIAGNOSIS — M9905 Segmental and somatic dysfunction of pelvic region: Secondary | ICD-10-CM | POA: Diagnosis not present

## 2018-03-19 DIAGNOSIS — M9905 Segmental and somatic dysfunction of pelvic region: Secondary | ICD-10-CM | POA: Diagnosis not present

## 2018-03-19 DIAGNOSIS — M5416 Radiculopathy, lumbar region: Secondary | ICD-10-CM | POA: Diagnosis not present

## 2018-03-19 DIAGNOSIS — M9903 Segmental and somatic dysfunction of lumbar region: Secondary | ICD-10-CM | POA: Diagnosis not present

## 2018-03-26 DIAGNOSIS — M5136 Other intervertebral disc degeneration, lumbar region: Secondary | ICD-10-CM | POA: Diagnosis not present

## 2018-03-26 DIAGNOSIS — M1A09X0 Idiopathic chronic gout, multiple sites, without tophus (tophi): Secondary | ICD-10-CM | POA: Diagnosis not present

## 2018-03-26 DIAGNOSIS — R739 Hyperglycemia, unspecified: Secondary | ICD-10-CM | POA: Diagnosis not present

## 2018-03-26 DIAGNOSIS — E7849 Other hyperlipidemia: Secondary | ICD-10-CM | POA: Diagnosis not present

## 2018-03-26 DIAGNOSIS — I10 Essential (primary) hypertension: Secondary | ICD-10-CM | POA: Diagnosis not present

## 2018-03-26 DIAGNOSIS — Z125 Encounter for screening for malignant neoplasm of prostate: Secondary | ICD-10-CM | POA: Diagnosis not present

## 2018-05-03 DIAGNOSIS — M5416 Radiculopathy, lumbar region: Secondary | ICD-10-CM | POA: Diagnosis not present

## 2018-05-03 DIAGNOSIS — M9905 Segmental and somatic dysfunction of pelvic region: Secondary | ICD-10-CM | POA: Diagnosis not present

## 2018-05-03 DIAGNOSIS — M9903 Segmental and somatic dysfunction of lumbar region: Secondary | ICD-10-CM | POA: Diagnosis not present

## 2018-05-06 DIAGNOSIS — M9903 Segmental and somatic dysfunction of lumbar region: Secondary | ICD-10-CM | POA: Diagnosis not present

## 2018-05-06 DIAGNOSIS — M9905 Segmental and somatic dysfunction of pelvic region: Secondary | ICD-10-CM | POA: Diagnosis not present

## 2018-05-06 DIAGNOSIS — M5416 Radiculopathy, lumbar region: Secondary | ICD-10-CM | POA: Diagnosis not present

## 2018-05-07 DIAGNOSIS — M545 Low back pain: Secondary | ICD-10-CM | POA: Diagnosis not present

## 2018-05-07 DIAGNOSIS — R300 Dysuria: Secondary | ICD-10-CM | POA: Diagnosis not present

## 2019-09-04 ENCOUNTER — Ambulatory Visit: Payer: Self-pay | Admitting: Dermatology

## 2019-12-10 ENCOUNTER — Other Ambulatory Visit: Payer: Self-pay | Admitting: Sports Medicine

## 2019-12-10 DIAGNOSIS — M2391 Unspecified internal derangement of right knee: Secondary | ICD-10-CM

## 2019-12-10 DIAGNOSIS — S8991XA Unspecified injury of right lower leg, initial encounter: Secondary | ICD-10-CM

## 2019-12-10 DIAGNOSIS — S83411A Sprain of medial collateral ligament of right knee, initial encounter: Secondary | ICD-10-CM

## 2019-12-10 DIAGNOSIS — G8929 Other chronic pain: Secondary | ICD-10-CM

## 2019-12-22 ENCOUNTER — Ambulatory Visit
Admission: RE | Admit: 2019-12-22 | Discharge: 2019-12-22 | Disposition: A | Discharge status: Home / Self Care | Payer: 59 | Source: Ambulatory Visit | Attending: Sports Medicine | Admitting: Sports Medicine

## 2019-12-22 ENCOUNTER — Other Ambulatory Visit: Payer: Self-pay

## 2019-12-22 DIAGNOSIS — M2391 Unspecified internal derangement of right knee: Secondary | ICD-10-CM | POA: Insufficient documentation

## 2019-12-22 DIAGNOSIS — M25561 Pain in right knee: Secondary | ICD-10-CM | POA: Insufficient documentation

## 2019-12-22 DIAGNOSIS — S83411A Sprain of medial collateral ligament of right knee, initial encounter: Secondary | ICD-10-CM

## 2019-12-22 DIAGNOSIS — S8991XA Unspecified injury of right lower leg, initial encounter: Secondary | ICD-10-CM | POA: Diagnosis present

## 2019-12-22 DIAGNOSIS — G8929 Other chronic pain: Secondary | ICD-10-CM | POA: Diagnosis present

## 2022-03-03 ENCOUNTER — Ambulatory Visit (INDEPENDENT_AMBULATORY_CARE_PROVIDER_SITE_OTHER): Payer: 59

## 2022-03-03 DIAGNOSIS — K640 First degree hemorrhoids: Secondary | ICD-10-CM | POA: Diagnosis not present

## 2022-03-03 DIAGNOSIS — Z86010 Personal history of colonic polyps: Secondary | ICD-10-CM | POA: Diagnosis present

## 2022-06-19 IMAGING — MR MR KNEE*R* W/O CM
6 series · 40 of 40 positions shown · non-contrast
Comparison: None.

CLINICAL DATA: Right knee pain after injury in Wednesday July, 2019

EXAM:
MRI OF THE RIGHT KNEE WITHOUT CONTRAST
TECHNIQUE: Multiplanar, multisequence MR imaging of the knee was performed. No
intravenous contrast was administered.

[Series 15: T2 fat-sat · axial · right · 4.0mm · 0.50mm/px · z∈[-98,+26]mm · 5 of 26 slices shown (1 of 3)]
[im 1/26]
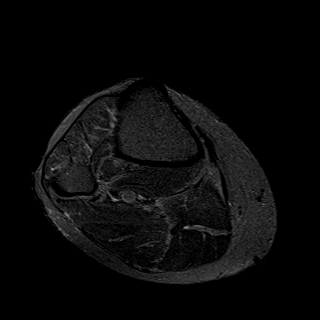
[im 7/26]
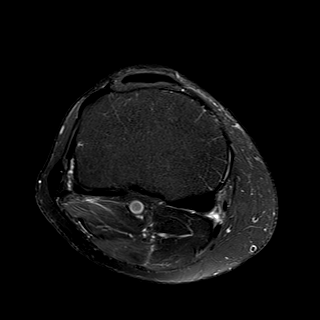
[im 13/26]
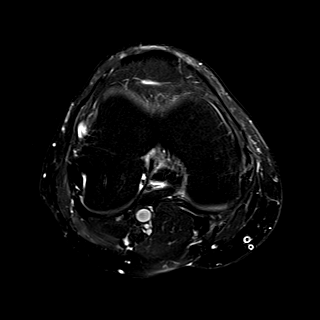
[im 19/26]
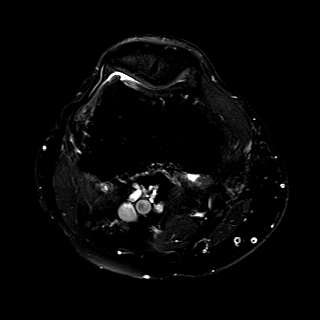
[im 26/26]
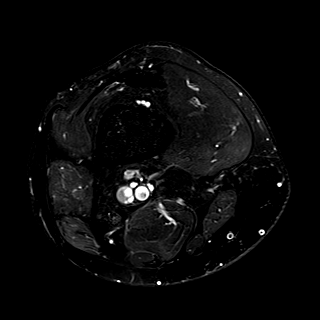

[Series 16: T1 · coronal · right · 4.0mm · 0.39mm/px · 6 of 32 slices shown]
[im 1/32]
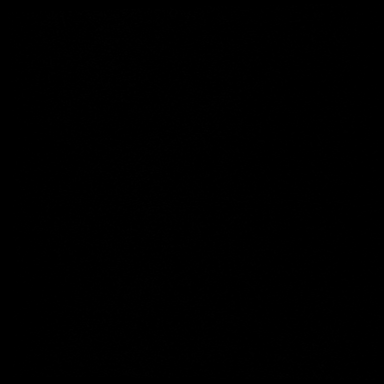
[im 7/32]
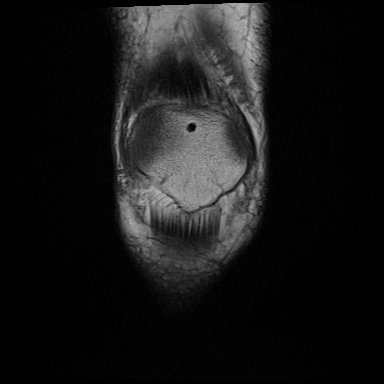
[im 13/32]
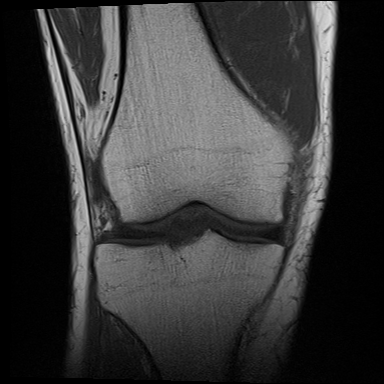
[im 19/32]
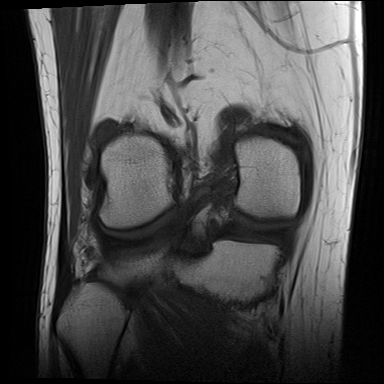
[im 25/32]
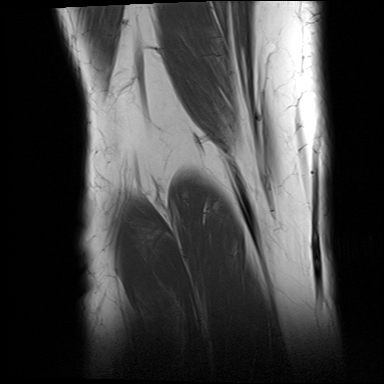
[im 32/32]
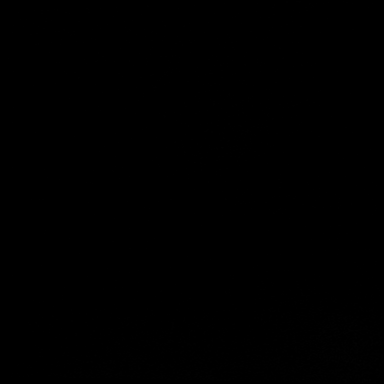

[Series 17: T2 fat-sat · coronal · right · 4.0mm · 0.59mm/px · 7 of 31 slices shown (2 of 3)]
[im 1/31]
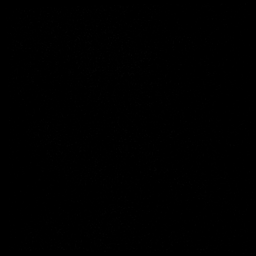
[im 6/31]
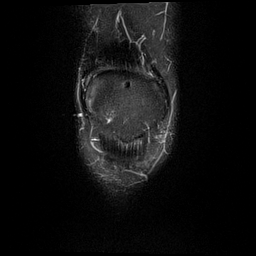
[im 11/31]
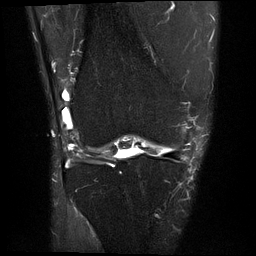
[im 16/31]
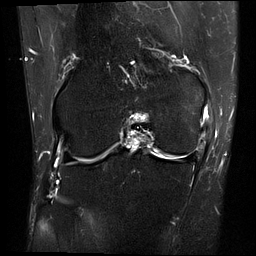
[im 21/31]
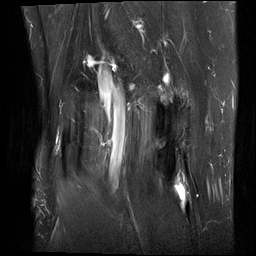
[im 26/31]
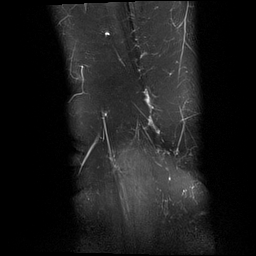
[im 31/31]
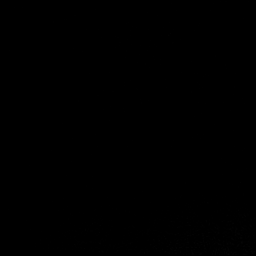

[Series 18: PD fat-sat · coronal · right · 4.0mm · 0.59mm/px · 7 of 30 slices shown (1 of 2)]
[im 1/30]
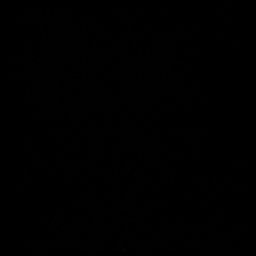
[im 5/30]
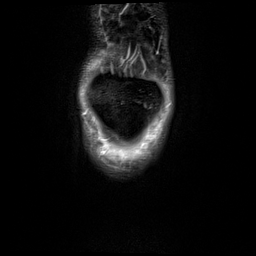
[im 10/30]
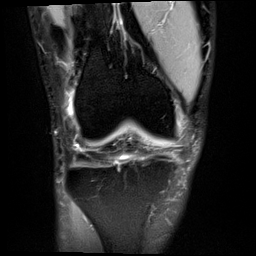
[im 15/30]
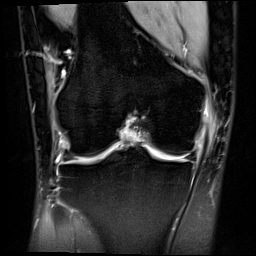
[im 20/30]
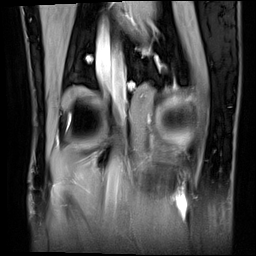
[im 25/30]
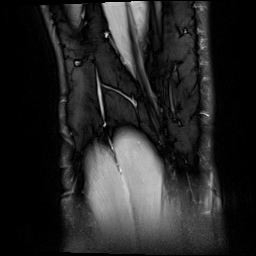
[im 30/30]
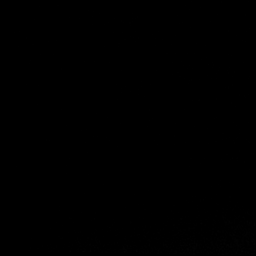

[Series 19: PD fat-sat · sagittal · right · 3.0mm · 0.59mm/px · 7 of 30 slices shown (2 of 2)]
[im 1/30]
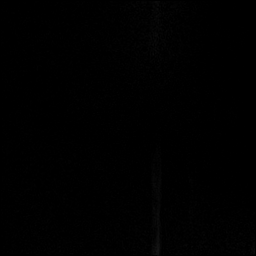
[im 5/30]
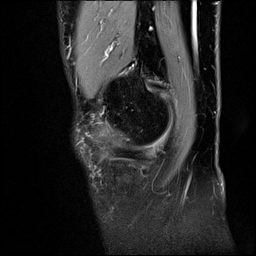
[im 10/30]
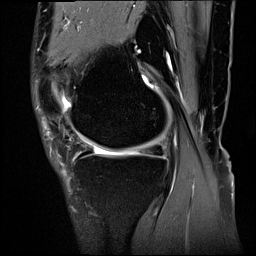
[im 15/30]
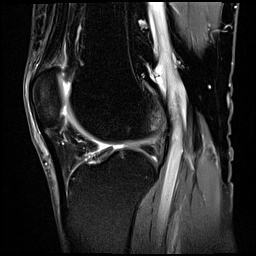
[im 20/30]
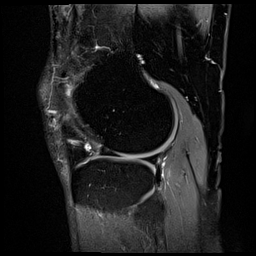
[im 25/30]
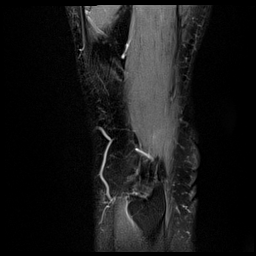
[im 30/30]
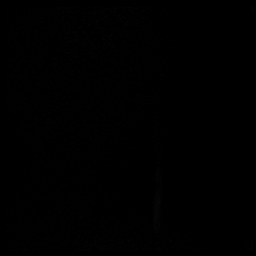

[Series 21: T2 fat-sat · sagittal · right · 3.0mm · 0.59mm/px · 8 of 35 slices shown (3 of 3)]
[im 1/35]
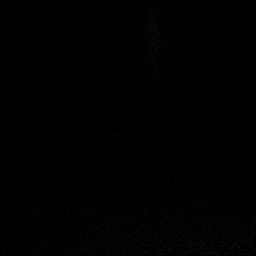
[im 5/35]
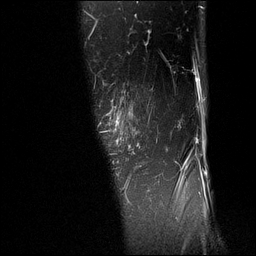
[im 10/35]
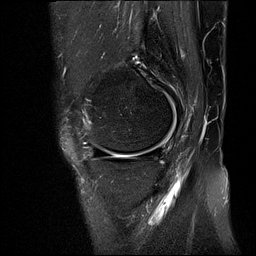
[im 15/35]
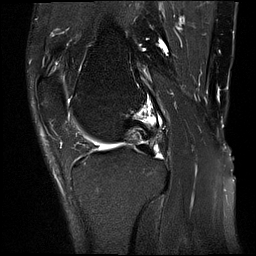
[im 20/35]
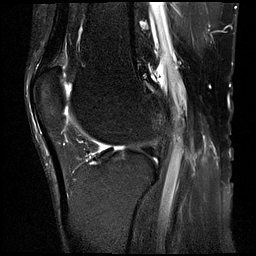
[im 25/35]
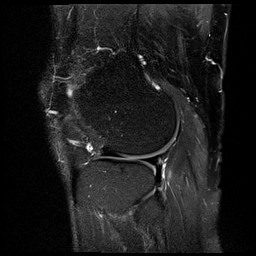
[im 30/35]
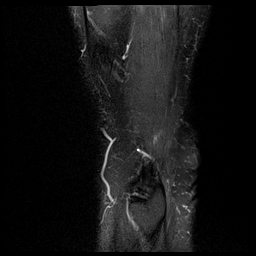
[im 35/35]
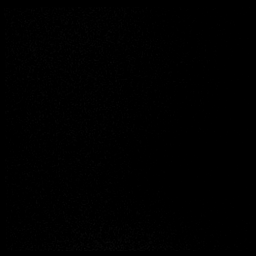

[40 of 40 positions shown; findings below may reference images not displayed]

FINDINGS: MENISCI

Medial meniscus: Intrasubstance degeneration with
fraying/irregularity of the undersurface of the posterior horn
(series 19, images 25-27).

Lateral meniscus:  Intact.

LIGAMENTS

Cruciates:  Intact ACL and PCL.

Collaterals: Thickening of the proximal MCL with partial thickness
tearing of the deep insertional fibers (series 17, image 16). No
complete ligament rupture. Lateral collateral ligament complex
intact.

CARTILAGE

Patellofemoral:  No chondral defect.

Medial: Tiny partial-thickness chondral surface irregularity of the
weight-bearing medial femoral condyle (series 18, image 16). No
full-thickness cartilage defect.

Lateral: Tiny full-thickness chondral fissure of the central aspect
of the lateral tibial plateau with underlying subchondral marrow
edema (series 17, image 18).

Joint: Trace joint fluid without significant effusion. Fat pads
within normal limits.

Popliteal Fossa: No Baker cyst. Intact popliteus tendon. Trace fluid
within the semimembranosus bursa.

Extensor Mechanism:  Intact quadriceps tendon and patellar tendon.

Bones: Subchondral marrow edema within the lateral tibial plateau.
No subchondral fracture. Osseous structures are otherwise within
normal limits.

Other: Trace prepatellar soft tissue edema.
IMPRESSION: 1. Grade 2 MCL sprain/partial tear.
2. Intrasubstance degeneration with fraying/irregularity of the
undersurface of the posterior horn of the medial meniscus.
3. Tiny full-thickness chondral fissure of the central aspect of the
lateral tibial plateau with underlying subchondral marrow edema.

## 2023-11-26 ENCOUNTER — Ambulatory Visit (INDEPENDENT_AMBULATORY_CARE_PROVIDER_SITE_OTHER)

## 2023-11-26 ENCOUNTER — Ambulatory Visit (INDEPENDENT_AMBULATORY_CARE_PROVIDER_SITE_OTHER): Admitting: Podiatry

## 2023-11-26 DIAGNOSIS — M722 Plantar fascial fibromatosis: Secondary | ICD-10-CM

## 2023-11-26 MED ORDER — METHYLPREDNISOLONE 4 MG PO TBPK: ORAL_TABLET | ORAL | 0 refills | Status: AC

## 2023-11-26 MED ORDER — MELOXICAM 15 MG PO TABS: 15.0000 mg | ORAL_TABLET | Freq: Every day | ORAL | 0 refills | Status: DC

## 2023-11-26 MED ORDER — TRIAMCINOLONE ACETONIDE 40 MG/ML IJ SUSP
20.0000 mg | Freq: Once | INTRAMUSCULAR | Status: AC
  Administered 2023-11-26: 20 mg

## 2023-11-26 NOTE — Progress Notes (Signed)
  Subjective:  Patient ID: Eric Fitzpatrick, male    DOB: [birth year],  MRN: [id number] HPI Chief Complaint  Patient presents with   Plantar Fasciitis    New pt- left foot pain heel had plantar fascitis in the past    63 y.o. male presents with the above complaint.   ROS: Denies fever chills swelling muscle aches pains calf pain back pain chest pain shortness of breath.  No past medical history on file.   Current Outpatient Medications:    meloxicam (MOBIC) 15 MG tablet, Take 1 tablet (15 mg total) by mouth daily., Disp: 30 tablet, Rfl: 0   methylPREDNISolone (MEDROL DOSEPAK) 4 MG TBPK tablet, Take 6 tabs the 1st day, take 5 tabs the 2nd day, take 4 tabs the 3rd day, take 3 tabs the 4th day, take 2 tabs the 5th day, and take 1 tab the 6th day, Disp: 21 tablet, Rfl: 0  Not on File Review of Systems Objective:  There were no vitals filed for this visit.  General: Well developed, nourished, in no acute distress, alert and oriented x3   Dermatological: Skin is warm, dry and supple bilateral. Nails x 10 are well maintained; remaining integument appears unremarkable at this time. There are no open sores, no preulcerative lesions, no rash or signs of infection present.  Vascular: Dorsalis Pedis artery and Posterior Tibial artery pedal pulses are 2/4 bilateral with immedate capillary fill time. Pedal hair growth present. No varicosities and no lower extremity edema present bilateral.   Neruologic: Grossly intact via light touch bilateral. Vibratory intact via tuning fork bilateral. Protective threshold with Semmes Wienstein monofilament intact to all pedal sites bilateral. Patellar and Achilles deep tendon reflexes 2+ bilateral. No Babinski or clonus noted bilateral.   Musculoskeletal: No gross boney pedal deformities bilateral. No pain, crepitus, or limitation noted with foot and ankle range of motion bilateral. Muscular strength 5/5 in all groups tested bilateral.  Gait: Unassisted,  Nonantalgic.    Radiographs:  Radiographs taken today demonstrate osseously mature individual left foot.  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  Assessment & Plan:   Assessment: Plantar fasciitis left heel  Plan: Discussed etiology pathology conservative surgical therapies discussed appropriate shoe gear stretching exercises ice therapy and shoe gear modifications.  I injected the left heel today 20 mg Kenalog  pentagrams Marcaine point maximal tenderness.  Tolerated procedure well without complications.  Started him on methylprednisolone to be followed by meloxicam.     Jini Horiuchi T. [place], [place]

## 2023-12-23 ENCOUNTER — Other Ambulatory Visit: Payer: Self-pay | Admitting: Podiatry

## 2023-12-24 ENCOUNTER — Ambulatory Visit: Admitting: Podiatry
